# Patient Record
Sex: Female | Born: 1995 | Race: White | Hispanic: No | Marital: Single | Smoking: Never smoker
Health system: Southern US, Community
[De-identification: ages and names within clinical notes are randomized; demographics above are authoritative.]

## PROBLEM LIST (undated history)

## (undated) DIAGNOSIS — R51 Headache: Secondary | ICD-10-CM

## (undated) HISTORY — DX: Headache: R51

---
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
## 2019-08-06 ENCOUNTER — Ambulatory Visit: Payer: Self-pay | Attending: Internal Medicine

## 2019-08-06 DIAGNOSIS — Z23 Encounter for immunization: Secondary | ICD-10-CM

## 2019-08-06 NOTE — Progress Notes (Signed)
   Covid-[facility] Vaccination [facility]  Name:  ARRIANNA CATALA    MRN: [id number] DOB: [birth year]  08/06/2019  Ms. Delao was observed post Covid-19 immunization for 15 minutes without incident. She was provided with Vaccine Information Sheet and instruction to access the V-Safe system.   Ms. Stormes was instructed to call 911 with any severe reactions post vaccine: Marland Kitchen Difficulty breathing  . Swelling of face and throat  . A fast heartbeat  . A bad rash all over body  . Dizziness and weakness   Immunizations Administered    Name Date Dose VIS Date Route   Pfizer COVID-19 Vaccine 08/06/2019 10:28 AM 0.3 mL 05/06/2019 Intramuscular   Manufacturer: [employer], [employer]   Lot: [id number]   NDC: [address]-3

## 2019-08-30 ENCOUNTER — Ambulatory Visit: Payer: Self-pay | Attending: Internal Medicine

## 2019-08-30 DIAGNOSIS — Z23 Encounter for immunization: Secondary | ICD-10-CM

## 2019-08-30 NOTE — Progress Notes (Signed)
   Covid-19 Vaccination Clinic  Name:  Linda Gallagher    MRN: [id number] DOB: Jun 08, 1995  08/30/2019  [name] was observed post Covid-19 immunization for 15 minutes without incident. She was provided with Vaccine Information Sheet and instruction to access the V-Safe system.   [name] was instructed to call 911 with any severe reactions post vaccine: Marland Kitchen Difficulty breathing  . Swelling of face and throat  . A fast heartbeat  . A bad rash all over body  . Dizziness and weakness   Immunizations Administered    Name Date Dose VIS Date Route   Pfizer COVID-19 Vaccine 08/30/2019 10:20 AM 0.3 mL 05/06/2019 Intramuscular   Manufacturer: [employer], [employer]   Lot: [id number]   NDC: [address]-0
